# Patient Record
Sex: Male | Born: 1995 | Race: Black or African American | Hispanic: No | Marital: Single | Smoking: Never smoker
Health system: Southern US, Community
[De-identification: ages and names within clinical notes are randomized; demographics above are authoritative.]

## PROBLEM LIST (undated history)

## (undated) DIAGNOSIS — R001 Bradycardia, unspecified: Secondary | ICD-10-CM

## (undated) DIAGNOSIS — E049 Nontoxic goiter, unspecified: Secondary | ICD-10-CM

## (undated) DIAGNOSIS — J302 Other seasonal allergic rhinitis: Secondary | ICD-10-CM

## (undated) DIAGNOSIS — R5383 Other fatigue: Secondary | ICD-10-CM

## (undated) HISTORY — DX: Bradycardia, unspecified: R00.1

## (undated) HISTORY — PX: ADENOIDECTOMY: SUR15

## (undated) HISTORY — PX: APPENDECTOMY: SHX54

## (undated) HISTORY — DX: Nontoxic goiter, unspecified: E04.9

## (undated) HISTORY — PX: TONSILLECTOMY: SUR1361

## (undated) HISTORY — DX: Other fatigue: R53.83

---
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
## 2016-06-10 ENCOUNTER — Encounter (HOSPITAL_COMMUNITY): Payer: Self-pay | Admitting: Nurse Practitioner

## 2016-06-10 ENCOUNTER — Emergency Department (HOSPITAL_COMMUNITY): Payer: Medicaid Other

## 2016-06-10 ENCOUNTER — Emergency Department (HOSPITAL_COMMUNITY)
Admission: EM | Admit: 2016-06-10 | Discharge: 2016-06-11 | Disposition: A | Discharge status: Home / Self Care | Payer: Medicaid Other | Attending: Emergency Medicine | Admitting: Emergency Medicine

## 2016-06-10 DIAGNOSIS — J029 Acute pharyngitis, unspecified: Secondary | ICD-10-CM | POA: Diagnosis not present

## 2016-06-10 LAB — RAPID STREP SCREEN (MED CTR MEBANE ONLY): STREPTOCOCCUS, GROUP A SCREEN (DIRECT): NEGATIVE

## 2016-06-10 MED ORDER — PREDNISONE 20 MG PO TABS
60.0000 mg | ORAL_TABLET | Freq: Once | ORAL | Status: AC
  Administered 2016-06-11: 60 mg via ORAL
  Filled 2016-06-10: qty 3

## 2016-06-10 MED ORDER — BENZONATATE 100 MG PO CAPS: 100.0000 mg | ORAL_CAPSULE | Freq: Three times a day (TID) | ORAL | 0 refills | Status: AC

## 2016-06-10 MED ORDER — MAGIC MOUTHWASH
10.0000 mL | Freq: Once | ORAL | Status: AC
  Administered 2016-06-11: 10 mL via ORAL
  Filled 2016-06-10 (×2): qty 10

## 2016-06-10 MED ORDER — CHLORHEXIDINE GLUCONATE 0.12 % MT SOLN: 15.0000 mL | Freq: Two times a day (BID) | OROMUCOSAL | 0 refills | Status: AC

## 2016-06-10 NOTE — ED Provider Notes (Signed)
[facility]-EMERGENCY DEPT Provider Note   CSN: [id number] Arrival date & time: 06/10/16  2222     History   Chief Complaint Chief Complaint  Patient presents with  . Sore Throat  . Malaise    HPI Gary Mendoza is a 20 y.o. male.  HPI   20 year old male presenting with complaints of cold symptoms. History obtained through mom who is at bedside. For the past for 5 days patient has had generalized body aches, nasal congestion, throat pain, nonproductive cough, subjective fever and chills. Symptom has become progressively worse. He is having difficulty eating and drinking and report early satiety. He has been taking over-the-counter medication including Robitussin, Sudafed, drinking tea with honey and lemon without adequate relief. Denies any recent sick contact. Has history of Hashimoto thyroiditis in the past. He has had his tonsil removed several years prior due to recurrent strep infection. Currently denies having active chest pain, short of breath, wheezing, hemoptysis, abdominal cramping, nausea vomiting diarrhea or rash.  Past Medical History:  Diagnosis Date  . Chest pain   . Fatigue   . Goiter   . Goiter, unspecified   . Seasonal allergies   . Sinus bradycardia by electrocardiogram     Patient Active Problem List   Diagnosis Date Noted  . Sinus bradycardia by electrocardiogram   . Goiter   . Fatigue   . Chest pain   . Goiter, unspecified 11/09/2010    Past Surgical History:  Procedure Laterality Date  . ADENOIDECTOMY    . APPENDECTOMY    . TONSILLECTOMY         Home Medications    Prior to Admission medications   Medication Sig Start Date End Date Taking? Authorizing Provider  cetirizine (ZYRTEC) 10 MG tablet Take 1 tablet (10 mg total) by mouth daily. 11/11/13   Junius FinnerErin O'Malley, PA-C  mometasone (NASONEX) 50 MCG/ACT nasal spray Place 2 sprays into the nose daily. Two sprays in each nostril until symptoms improve, then 1 spray to each nostril daily during allergy  season 11/11/13   Junius FinnerErin O'Malley, PA-C    Family History Family History  Problem Relation Age of Onset  . Thyroid disease Mother     Luiz BlareGraves' disease with exophthalmos, treated with I-131.  Marland Kitchen. Anemia Mother   . Thyroid disease Maternal Grandmother   . Obesity Maternal Grandmother   . Diabetes Maternal Grandmother   . Cancer Neg Hx     Social History Social History  Substance Use Topics  . Smoking status: Never Smoker  . Smokeless tobacco: Not on file  . Alcohol use No     Allergies   Patient has no known allergies.   Review of Systems Review of Systems  All other systems reviewed and are negative.    Physical Exam Updated Vital Signs BP 153/67 (BP Location: Right Arm)   Pulse (!) 50   Temp 98.3 F (36.8 C) (Oral)   Resp 16   SpO2 99%   Physical Exam  Constitutional: He is oriented to person, place, and time. He appears well-developed and well-nourished. No distress.  Nontoxic in appearance, resting in no acute distress.  HENT:  Head: Atraumatic.  Ears: Scarring to bilateral TMs without any signs of infection or effusion Nose: Mild rhinorrhea Throat: Uvula is midline no tonsillar enlargement or exudates, no trismus  Eyes: Conjunctivae are normal.  Neck: Neck supple.  No nuchal rigidity, no thyromegaly, no stridor.  Cardiovascular: Normal rate and regular rhythm.   Pulmonary/Chest: Effort normal and breath sounds normal.  No respiratory distress. He has no wheezes.  Abdominal: Soft. He exhibits no distension. There is no tenderness.  No splenomegaly  Neurological: He is alert and oriented to person, place, and time.  Skin: No rash noted.  Psychiatric: He has a normal mood and affect.  Nursing note and vitals reviewed.    ED Treatments / Results  Labs (all labs ordered are listed, but only abnormal results are displayed) Labs Reviewed  RAPID STREP SCREEN (NOT AT [facility])  CULTURE, GROUP A STREP [facility])    EKG  EKG Interpretation None        Radiology No results found.  Procedures Procedures (including critical care time)  Medications Ordered in ED Medications  magic mouthwash (not administered)  predniSONE (DELTASONE) tablet 60 mg (not administered)     Initial Impression / Assessment and Plan / ED Course  I have reviewed the triage vital signs and the nursing notes.  Pertinent labs & imaging results that were available during my care of the patient were reviewed by me and considered in my medical decision making (see chart for details).  Clinical Course     BP 153/67 (BP Location: Right Arm)   Pulse (!) 50   Temp 98.3 F (36.8 C) (Oral)   Resp 16   SpO2 99%    Final Clinical Impressions(s) / ED Diagnoses   Final diagnoses:  Viral pharyngitis    New Prescriptions New Prescriptions   BENZONATATE (TESSALON) 100 MG CAPSULE    Take 1 capsule (100 mg total) by mouth every 8 (eight) hours.   CHLORHEXIDINE (PERIDEX) 0.12 % SOLUTION    Use as directed 15 mLs in the mouth or throat 2 (two) times daily.   11:02 PM Patient here with cold symptoms. He is afebrile, vital signs stable. Mild bradycardia but patient is an athlete and I suspect this is likely baseline. He denies any lightheadedness of dizziness.  Will obtain rapid strep test, and chest x-ray.  11:46 PM Strep test negative.  CXR without acute changes.  Will provide sxs treatment.  reassurasnce given. Likely viral resp infection.  F/u with PCP as needed. Return precaution given.     Fayrene HelperBowie Reesha Debes, PA-C 06/10/16 2347    Gerhard Munchobert Lockwood, MD 06/11/16 [phone]

## 2016-06-10 NOTE — ED Triage Notes (Signed)
Pt presents with c/o fever at home, sore throat and difficulty swallowing and "inability to talk, feeling like he is chocking" mother at beside is providing this triage hx. Other c/o include runny nose, congestion, malaise without headache.

## 2016-06-13 LAB — CULTURE, GROUP A STREP (THRC)

## 2017-01-21 ENCOUNTER — Emergency Department (HOSPITAL_COMMUNITY)
Admission: EM | Admit: 2017-01-21 | Discharge: 2017-01-21 | Disposition: A | Discharge status: Home / Self Care | Payer: Medicaid Other | Attending: Emergency Medicine | Admitting: Emergency Medicine

## 2017-01-21 ENCOUNTER — Encounter (HOSPITAL_COMMUNITY): Payer: Self-pay | Admitting: Emergency Medicine

## 2017-01-21 DIAGNOSIS — J029 Acute pharyngitis, unspecified: Secondary | ICD-10-CM | POA: Insufficient documentation

## 2017-01-21 DIAGNOSIS — Z79899 Other long term (current) drug therapy: Secondary | ICD-10-CM | POA: Insufficient documentation

## 2017-01-21 LAB — RAPID STREP SCREEN (MED CTR MEBANE ONLY): Streptococcus, Group A Screen (Direct): NEGATIVE

## 2017-01-21 NOTE — Discharge Instructions (Signed)
Please read instructions below.  You can take tylenol or advil as needed for sore throat or body aches.  Drink plenty of water. Eat soft foods until your throat feels better. Follow up with your primary care provider as needed.  Return to the ER for difficulty swallowing liquids, difficulty breathing, or new or worsening symptoms.

## 2017-01-21 NOTE — ED Provider Notes (Signed)
[facility]-EMERGENCY DEPT Provider Note   CSN: [id number] Arrival date & time: 01/21/17  1421  By signing my name below, I, Ny'Kea Lewis, attest that this documentation has been prepared under the direction and in the presence of [place] Russo, PA-C.  Electronically Signed: Karren Cobble, ED Scribe. 01/21/17. 4:27 PM.  History   Chief Complaint Chief Complaint  Patient presents with  . Sore Throat   The history is provided by the patient. No language interpreter was used.   HPI Comments: Gary Mendoza is a 21 y.o. male who presents to the Emergency Department complaining of sudden onset, gradually worsening sore throat that began one week ago. He notes associated pain with swallowing, burning sensation when swallowing, and a mild non-productive cough. He has tried drinking hot tea with mild relief. He reports recent sick contact with similar symptoms. PSHx of tonsillectomy and adenoidectomy. Denies trouble swallowing, fever, congestion, ear pain, or, abdominal pain.   Past Medical History:  Diagnosis Date  . Chest pain   . Fatigue   . Goiter   . Goiter, unspecified   . Seasonal allergies   . Sinus bradycardia by electrocardiogram     Patient Active Problem List   Diagnosis Date Noted  . Sinus bradycardia by electrocardiogram   . Goiter   . Fatigue   . Chest pain   . Goiter, unspecified 11/09/2010    Past Surgical History:  Procedure Laterality Date  . ADENOIDECTOMY    . APPENDECTOMY    . TONSILLECTOMY      Home Medications    Prior to Admission medications   Medication Sig Start Date End Date Taking? Authorizing Provider  benzonatate (TESSALON) 100 MG capsule Take 1 capsule (100 mg total) by mouth every 8 (eight) hours. 06/10/16   Fayrene Helper, PA-C  cetirizine (ZYRTEC) 10 MG tablet Take 1 tablet (10 mg total) by mouth daily. 11/11/13   Lurene Shadow, PA-C  chlorhexidine (PERIDEX) 0.12 % solution Use as directed 15 mLs in the mouth or throat 2 (two) times daily. 06/10/16    Fayrene Helper, PA-C  mometasone (NASONEX) 50 MCG/ACT nasal spray Place 2 sprays into the nose daily. Two sprays in each nostril until symptoms improve, then 1 spray to each nostril daily during allergy season 11/11/13   Lurene Shadow, PA-C   Family History Family History  Problem Relation Age of Onset  . Thyroid disease Mother        Luiz Blare' disease with exophthalmos, treated with I-131.  Marland Kitchen Anemia Mother   . Thyroid disease Maternal Grandmother   . Obesity Maternal Grandmother   . Diabetes Maternal Grandmother   . Cancer Neg Hx    Social History Social History  Substance Use Topics  . Smoking status: Never Smoker  . Smokeless tobacco: Never Used  . Alcohol use No   Allergies   Patient has no known allergies.  Review of Systems Review of Systems  Constitutional: Negative for fever.  HENT: Positive for sore throat. Negative for congestion, ear pain, trouble swallowing and voice change.   Respiratory: Positive for cough. Negative for shortness of breath.   Gastrointestinal: Negative for abdominal pain.   Physical Exam Updated Vital Signs BP 123/84 (BP Location: Left Arm)   Pulse 64   Temp 98.1 F (36.7 C) (Oral)   Resp 17   Ht 5\' 9"  (1.753 m)   Wt 72.6 kg (160 lb)   SpO2 99%   BMI 23.63 kg/m   Physical Exam  Constitutional: He appears well-developed and  well-nourished. No distress.  Tolerating secretions. Well appearing.  HENT:  Head: Normocephalic and atraumatic.  Right Ear: Tympanic membrane, external ear and ear canal normal.  Left Ear: Tympanic membrane, external ear and ear canal normal.  Nose: Nose normal.  Mouth/Throat: Uvula is midline. No trismus in the jaw. No uvula swelling. Posterior oropharyngeal erythema present. No oropharyngeal exudate or posterior oropharyngeal edema.  Eyes: Conjunctivae are normal.  Neck: Normal range of motion. Neck supple.  Cardiovascular: Normal rate, regular rhythm, normal heart sounds and intact distal pulses.  Exam reveals no  friction rub.   No murmur heard. Pulmonary/Chest: Effort normal and breath sounds normal. No stridor. No respiratory distress. He has no wheezes. He has no rales.  Abdominal: Soft. Bowel sounds are normal. He exhibits no distension. There is no tenderness. There is no rebound and no guarding.  Lymphadenopathy:    He has cervical adenopathy (mild anterior).  Psychiatric: He has a normal mood and affect. His behavior is normal.  Nursing note and vitals reviewed.  ED Treatments / Results  DIAGNOSTIC STUDIES: Oxygen Saturation is 99% on RA, normal by my interpretation.   COORDINATION OF CARE: 4:10 PM-Discussed next steps with pt. Pt verbalized understanding and is agreeable with the plan.   Labs (all labs ordered are listed, but only abnormal results are displayed) Labs Reviewed  RAPID STREP SCREEN (NOT [facility])  CULTURE, GROUP A STREP [facility])    EKG  EKG Interpretation None       Radiology No results found.  Procedures Procedures (including critical care time)  Medications Ordered in ED Medications - No data to display   Initial Impression / Assessment and Plan / ED Course  I have reviewed the triage vital signs and the nursing notes.  Pertinent labs & imaging results that were available during my care of the patient were reviewed by me and considered in my medical decision making (see chart for details).     Patients symptoms are consistent with pharyngitis, likely viral etiology. Patient is afebrile, tolerating secretions. Presentation not concerning for PTA or infxn spread to soft tissue. No difficulty breathing or swallowing. No trismus or uvula deviation. Discussed that antibiotics are not indicated for viral infections. Pt will be discharged with symptomatic treatment. Strict return precautions given. Pt is hemodynamically stable and in NAD prior to discharge.  Discussed results, findings, treatment and follow up. Patient advised of return precautions. Patient  verbalized understanding and agreed with plan.   Final Clinical Impressions(s) / ED Diagnoses   Final diagnoses:  Viral pharyngitis    New Prescriptions Discharge Medication List as of 01/21/2017  4:35 PM    I personally performed the services described in this documentation, which was scribed in my presence. The recorded information has been reviewed and is accurate.     Russo, [place] N, PA-C 01/21/17 1703    Doug SouJacubowitz, Sam, MD 01/21/17 2119

## 2017-01-21 NOTE — ED Triage Notes (Signed)
Pt. Stated, I've had a sore throat for 1 week.

## 2017-01-22 ENCOUNTER — Emergency Department (HOSPITAL_COMMUNITY): Payer: Medicaid Other

## 2017-01-22 ENCOUNTER — Emergency Department (HOSPITAL_COMMUNITY)
Admission: EM | Admit: 2017-01-22 | Discharge: 2017-01-22 | Disposition: A | Discharge status: Home / Self Care | Payer: Medicaid Other | Attending: Emergency Medicine | Admitting: Emergency Medicine

## 2017-01-22 ENCOUNTER — Encounter (HOSPITAL_COMMUNITY): Payer: Self-pay | Admitting: Emergency Medicine

## 2017-01-22 DIAGNOSIS — R079 Chest pain, unspecified: Secondary | ICD-10-CM | POA: Insufficient documentation

## 2017-01-22 LAB — CBC
HCT: 44.7 % (ref 39.0–52.0)
Hemoglobin: 16.4 g/dL (ref 13.0–17.0)
MCH: 31.4 pg (ref 26.0–34.0)
MCHC: 36.7 g/dL — ABNORMAL HIGH (ref 30.0–36.0)
MCV: 85.5 fL (ref 78.0–100.0)
PLATELETS: 258 10*3/uL (ref 150–400)
RBC: 5.23 MIL/uL (ref 4.22–5.81)
RDW: 12.2 % (ref 11.5–15.5)
WBC: 5.8 10*3/uL (ref 4.0–10.5)

## 2017-01-22 LAB — TSH: TSH: 0.567 u[IU]/mL (ref 0.350–4.500)

## 2017-01-22 LAB — D-DIMER, QUANTITATIVE: D-Dimer, Quant: <0.27 ug/mL-FEU (ref 0.00–0.50)

## 2017-01-22 LAB — BASIC METABOLIC PANEL
Anion gap: 7 (ref 5–15)
BUN: 9 mg/dL (ref 6–20)
CHLORIDE: 102 mmol/L (ref 101–111)
CO2: 30 mmol/L (ref 22–32)
CREATININE: 1.19 mg/dL (ref 0.61–1.24)
Calcium: 9.7 mg/dL (ref 8.9–10.3)
GFR calc non Af Amer: >60 mL/min (ref >60)
Glucose, Bld: 79 mg/dL (ref 65–99)
Potassium: 3.7 mmol/L (ref 3.5–5.1)
Sodium: 139 mmol/L (ref 135–145)

## 2017-01-22 LAB — POCT I-STAT TROPONIN I
TROPONIN I, POC: 0 ng/mL (ref 0.00–0.08)
Troponin i, poc: 0 ng/mL (ref 0.00–0.08)

## 2017-01-22 LAB — T4, FREE: Free T4: 0.86 ng/dL (ref 0.61–1.12)

## 2017-01-22 MED ORDER — PANTOPRAZOLE SODIUM 20 MG PO TBEC: 20.0000 mg | DELAYED_RELEASE_TABLET | Freq: Every day | ORAL | 1 refills | Status: AC

## 2017-01-22 MED ORDER — FAMOTIDINE 20 MG PO TABS: 20.0000 mg | ORAL_TABLET | Freq: Two times a day (BID) | ORAL | 0 refills | Status: AC

## 2017-01-22 MED ORDER — GI COCKTAIL ~~LOC~~
30.0000 mL | Freq: Once | ORAL | Status: AC
  Administered 2017-01-22: 30 mL via ORAL
  Filled 2017-01-22: qty 30

## 2017-01-22 NOTE — Discharge Instructions (Signed)
Your lab results are reassuring today.   Begin taking the Protonix daily. Take this medication 20-30 minutes before your first meal the day, regardless of how you feel.  Take the Pepcid, as prescribed.  Avoid alcohol, NSAIDs such as ibuprofen or naproxen, spicy foods, and anything else that aggravates the sensation you are experiencing.   Follow up with gastroenterology as soon as possible. Call the number provided to set up an appointment.   Follow up with endocrinology as soon as possible.

## 2017-01-22 NOTE — ED Provider Notes (Signed)
[facility]-EMERGENCY DEPT Provider Note   CSN: [id number] Arrival date & time: 01/22/17  1159   By signing my name below, I, Clarisse GougeXavier Herndon, attest that this documentation has been prepared under the direction and in the presence of Sharena Dibenedetto, PA-C. Electronically signed, Clarisse GougeXavier Herndon, ED Scribe. 01/22/17. 1:57 PM.  History   Chief Complaint Chief Complaint  Patient presents with  . Chest Pain   The history is provided by the patient, medical records and a parent. No language interpreter was used.    Gary Mendoza is a 21 y.o. male with h/o hashimoto's, chest pain, bradycardia, chronic fatigue, and goiter presenting to the Emergency Department concerning intermittent chest pain x 1 week.  He describes 9/10, throbbing, non-radiating pain, and he states the pain is worse with eating and drinking as well as lying supine. Patient states he has not experienced these symptoms before. Patient was once followed by a pediatric endocrinologist with last visit in 2014, however, states he was told he no longer needed their care. No N/V/D, fever/chills, abdominal pain, cough, rash, SOB, heat/cold intolerance, dizziness, headache, or any other complaints.    Past Medical History:  Diagnosis Date  . Chest pain   . Fatigue   . Goiter   . Goiter, unspecified   . Seasonal allergies   . Sinus bradycardia by electrocardiogram     Patient Active Problem List   Diagnosis Date Noted  . Sinus bradycardia by electrocardiogram   . Goiter   . Fatigue   . Chest pain   . Goiter, unspecified 11/09/2010    Past Surgical History:  Procedure Laterality Date  . ADENOIDECTOMY    . APPENDECTOMY    . TONSILLECTOMY         Home Medications    Prior to Admission medications   Medication Sig Start Date End Date Taking? Authorizing Provider  benzonatate (TESSALON) 100 MG capsule Take 1 capsule (100 mg total) by mouth every 8 (eight) hours. Patient taking differently: Take 100 mg by mouth every 8  (eight) hours as needed for cough.  06/10/16  Yes Fayrene Helperran, [place], PA-C  cetirizine (ZYRTEC) 10 MG tablet Take 1 tablet (10 mg total) by mouth daily. Patient taking differently: Take 10 mg by mouth daily as needed for allergies.  11/11/13  Yes Phelps, Erin O, PA-C  chlorhexidine (PERIDEX) 0.12 % solution Use as directed 15 mLs in the mouth or throat 2 (two) times daily. Patient taking differently: Use as directed 15 mLs in the mouth or throat 2 (two) times daily as needed.  06/10/16  Yes Fayrene Helperran, [place], PA-C  famotidine (PEPCID) 20 MG tablet Take 1 tablet (20 mg total) by mouth 2 (two) times daily. 01/22/17 01/27/17  Aletta Edmunds C, PA-C  pantoprazole (PROTONIX) 20 MG tablet Take 1 tablet (20 mg total) by mouth daily. 01/22/17   Chrisangel Eskenazi, Hillard DankerShawn C, PA-C    Family History Family History  Problem Relation Age of Onset  . Thyroid disease Mother        Luiz BlareGraves' disease with exophthalmos, treated with I-131.  Marland Kitchen. Anemia Mother   . Thyroid disease Maternal Grandmother   . Obesity Maternal Grandmother   . Diabetes Maternal Grandmother   . Cancer Neg Hx     Social History Social History  Substance Use Topics  . Smoking status: Never Smoker  . Smokeless tobacco: Never Used  . Alcohol use No     Allergies   Pineapple and Bee venom   Review of Systems Review of Systems  Constitutional:  Negative for diaphoresis, fatigue and fever.  HENT: Negative for rhinorrhea and trouble swallowing.        Pain in the chest with swallowing.  Eyes: Negative for visual disturbance.  Respiratory: Negative for cough, chest tightness and shortness of breath.   Cardiovascular: Positive for chest pain. Negative for palpitations and leg swelling.  Gastrointestinal: Negative for abdominal pain, diarrhea, nausea and vomiting.  Musculoskeletal: Negative for back pain and neck pain.  Skin: Negative for rash.  Neurological: Negative for dizziness, syncope, weakness, light-headedness, numbness and headaches.     Physical  Exam Updated Vital Signs BP 120/70 (BP Location: Left Arm)   Pulse (!) 48   Temp 98.2 F (36.8 C) (Oral)   Resp 19   Ht 5\' 9"  (1.753 m)   Wt 160 lb (72.6 kg)   SpO2 100%   BMI 23.63 kg/m   Physical Exam  Constitutional: He appears well-developed and well-nourished. No distress.  HENT:  Head: Normocephalic and atraumatic.  Mouth/Throat: Oropharynx is clear and moist.  Eyes: Pupils are equal, round, and reactive to light. Conjunctivae and EOM are normal.  Neck: Normal range of motion, full passive range of motion without pain and phonation normal. Neck supple. No JVD present. No tracheal tenderness present. No edema present.  Cardiovascular: Normal rate, regular rhythm, normal heart sounds and intact distal pulses.   Pulmonary/Chest: Effort normal and breath sounds normal. No stridor. No respiratory distress. He exhibits no tenderness.  No increased pain with leaning forward.  Abdominal: Soft. There is no tenderness. There is no guarding.  Musculoskeletal: He exhibits no edema.  Lymphadenopathy:    He has no cervical adenopathy.  Neurological: He is alert.  Skin: Skin is warm and dry. Capillary refill takes less than 2 seconds. He is not diaphoretic.  Psychiatric: He has a normal mood and affect. His behavior is normal.  Nursing note and vitals reviewed.    ED Treatments / Results  DIAGNOSTIC STUDIES: Oxygen Saturation is 100% on RA, NL by my interpretation.    COORDINATION OF CARE: 1:52 PM-Discussed next steps with pt. Pt verbalized understanding and is agreeable with the plan. Will order IV, thyroid test, D-Dimer and GI cocktail. Pt prepared for d/c, advised of symptomatic care at home, F/U instructions and return precautions.    Labs (all labs ordered are listed, but only abnormal results are displayed) Labs Reviewed  CBC - Abnormal; Notable for the following:       Result Value   MCHC 36.7 (*)    All other components within normal limits  BASIC METABOLIC PANEL   D-DIMER, QUANTITATIVE (NOT AT [facility])  TSH  T4, FREE  I-STAT TROPOININ, ED  POCT I-STAT TROPONIN I  I-STAT TROPOININ, ED  POCT I-STAT TROPONIN I    EKG  EKG Interpretation  Date/Time:  Tuesday January 22 2017 12:05:33 EDT Ventricular Rate:  51 PR Interval:    QRS Duration: 93 QT Interval:  388 QTC Calculation: 358 R Axis:   53 Text Interpretation:  Sinus rhythm Probable anteroseptal infarct, old No acute changes No significant change since last tracing Confirmed by Derwood Kaplan [phone]) on 01/22/2017 4:26:31 PM       Radiology Dg Chest 2 View  Result Date: 01/22/2017 CLINICAL DATA:  21 year old male with sharp pain in the sternal area for the past 2-1/2 weeks. Difficulty eating. Loss of appetite. EXAM: CHEST  2 VIEW COMPARISON:  Chest x-ray 06/10/2016. FINDINGS: Lung volumes are normal. No consolidative airspace disease. No pleural effusions. No pneumothorax. No  pulmonary nodule or mass noted. Pulmonary vasculature and the cardiomediastinal silhouette are within normal limits. IMPRESSION: No radiographic evidence of acute cardiopulmonary disease. Electronically Signed   By: Trudie Reed M.D.   On: 01/22/2017 12:33    Procedures Procedures (including critical care time)  Medications Ordered in ED Medications  gi cocktail (Maalox,Lidocaine,Donnatal) (30 mLs Oral Given 01/22/17 1414)     Initial Impression / Assessment and Plan / ED Course  I have reviewed the triage vital signs and the nursing notes.  Pertinent labs & imaging results that were available during my care of the patient were reviewed by me and considered in my medical decision making (see chart for details).  Clinical Course as of Jan 24 1448  Tue Jan 22, 2017  1557 Pain has resolved following GI cocktail.   [SJ]    Clinical Course User Index [SJ] Marijane Trower C, PA-C    Patient presents with intermittent, atypical chest pain. Worse with eating and lying supine. Pain is not exertional. Patient's  description and pain pattern gives suspicion for upper GI source of the patient's discomfort rather than cardiac or pulmonary. D-dimer negative. Delta troponins negative. Symptoms relieved with GI cocktail. GI and endocrinology follow-up. The patient was given instructions for home care as well as return precautions. Patient voices understanding of these instructions, accepts the plan, and is comfortable with discharge.  Findings and plan of care discussed with Derwood Kaplan, MD.    Vitals:   01/22/17 1208 01/22/17 1323 01/22/17 1504 01/22/17 1535  BP: 120/70   116/68  Pulse: (!) 48 (!) 44 (!) 44 (!) 44  Resp: 19  18   Temp: 98.2 F (36.8 C)   98.3 F (36.8 C)  TempSrc: Oral   Oral  SpO2: 100% 100%  100%  Weight:      Height:      Has previous history of bradycardia. States a HR in the 40s is normal for him.   Final Clinical Impressions(s) / ED Diagnoses   Final diagnoses:  Chest pain, unspecified type    New Prescriptions Discharge Medication List as of 01/22/2017  4:28 PM    START taking these medications   Details  famotidine (PEPCID) 20 MG tablet Take 1 tablet (20 mg total) by mouth 2 (two) times daily., Starting Tue 01/22/2017, Until Sun 01/27/2017, Print    pantoprazole (PROTONIX) 20 MG tablet Take 1 tablet (20 mg total) by mouth daily., Starting Tue 01/22/2017, Print      I personally performed the services described in this documentation, which was scribed in my presence. The recorded information has been reviewed and is accurate.   Anselm Pancoast, PA-C 01/23/17 1450    Derwood Kaplan, MD 01/24/17 [phone]

## 2017-01-22 NOTE — ED Notes (Signed)
Pt is alert and oriented and verbally responsive.Pt mother is at bedside , and anxious as to how long pt will continue to be here in the ED. Pt second troponin level has been drawn.

## 2017-01-22 NOTE — ED Triage Notes (Signed)
Patient having mid chest pain for over week. Patient reports that when he is sitting still pain is sharp then when patient is eating or drinking feels like a choking sensation.

## 2017-01-23 LAB — CULTURE, GROUP A STREP (THRC)

## 2017-02-09 IMAGING — CR DG CHEST 2V
2 series · 2 of 2 positions shown · non-contrast
Comparison: Chest radiograph May 02, 2011

CLINICAL DATA: Sore throat, dysphagia, choking sensation. Runny
nose, congestion. History of goiter.

EXAM:
CHEST  2 VIEW

[w chest pa]
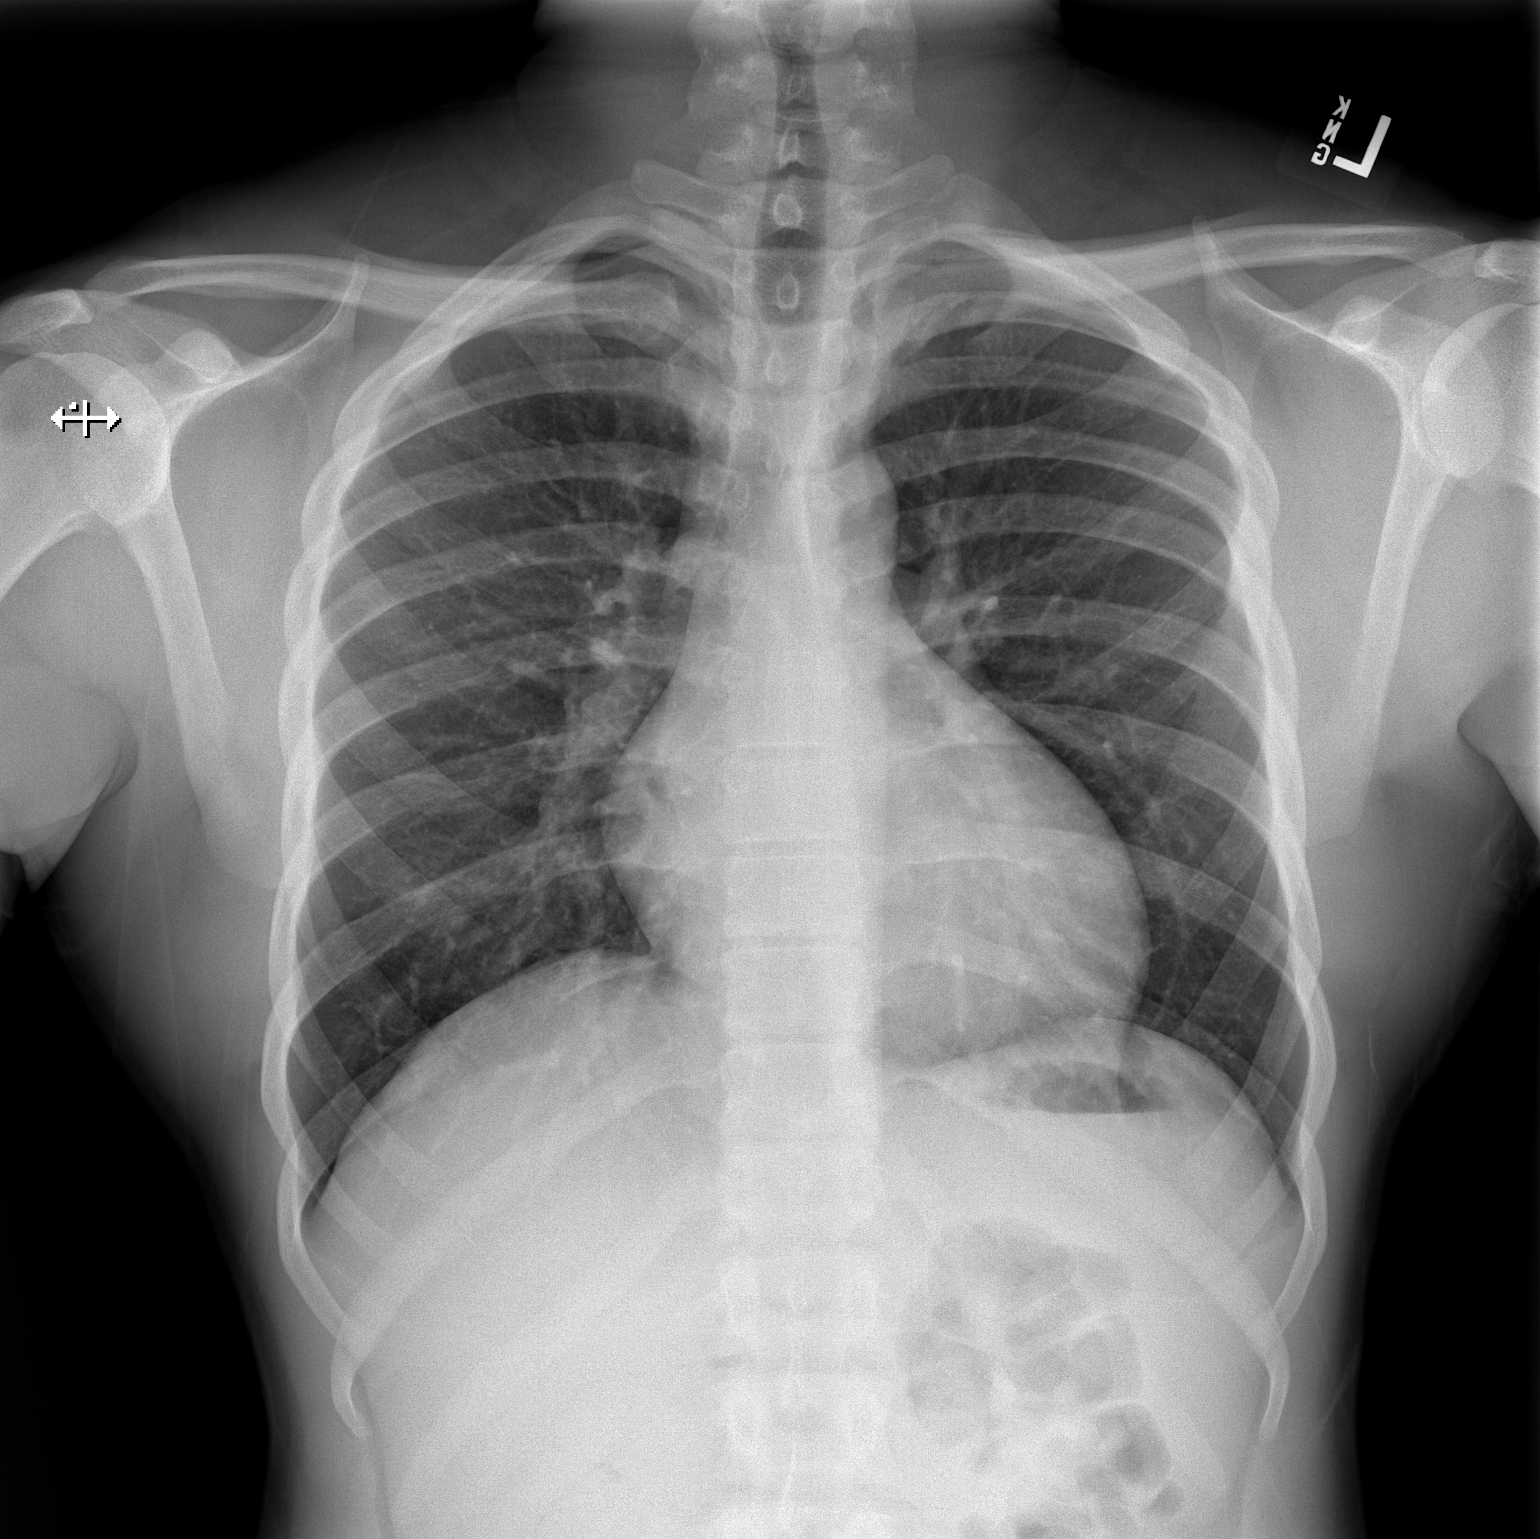

[w chest lat]
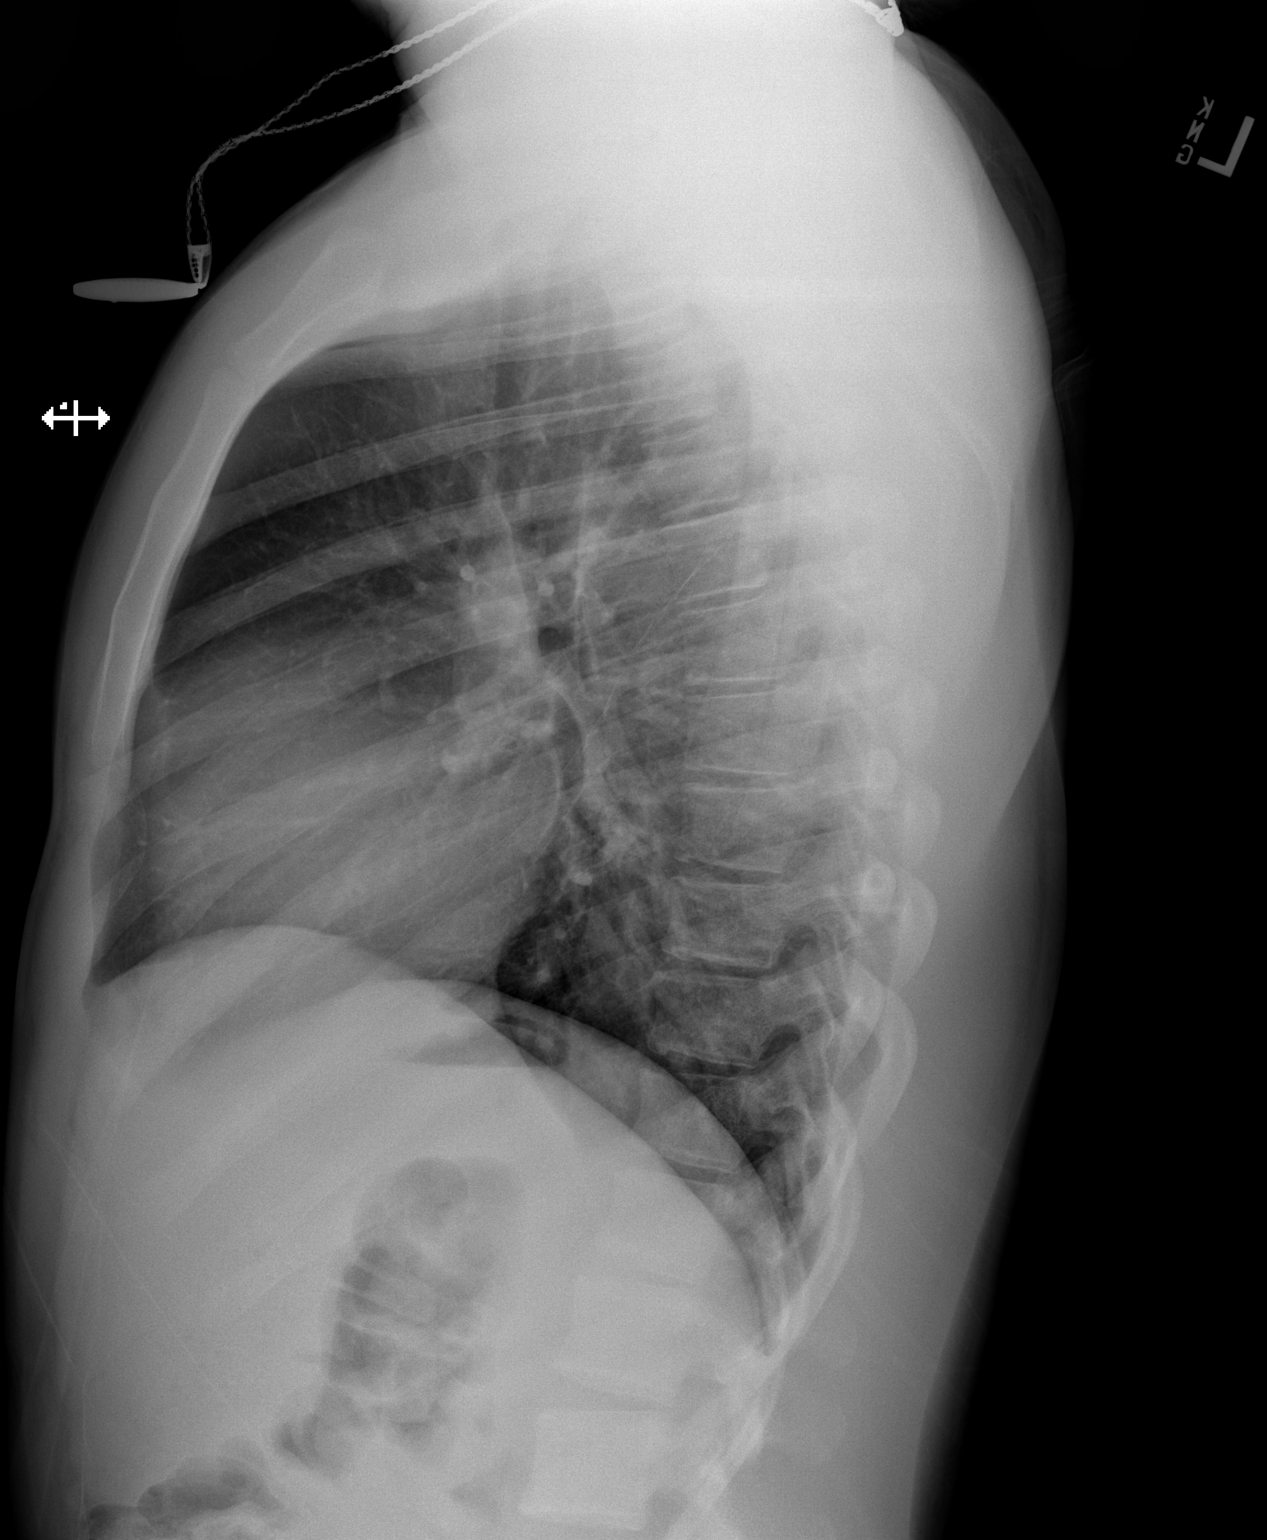

[2 of 2 positions shown; findings below may reference images not displayed]

FINDINGS: Cardiomediastinal silhouette is normal. No pleural effusions or
focal consolidations. Trachea projects midline and there is no
pneumothorax. Soft tissue planes and included osseous structures are
non-suspicious.
IMPRESSION: Normal chest.

## 2017-09-23 IMAGING — CR DG CHEST 2V
2 series · 2 of 2 positions shown · non-contrast
Comparison: Chest x-ray 06/10/2016.

CLINICAL DATA: 21-year-old male with sharp pain in the sternal area
for the past 2-1/2 weeks. Difficulty eating. Loss of appetite.

EXAM:
CHEST  2 VIEW

[w chest pa]
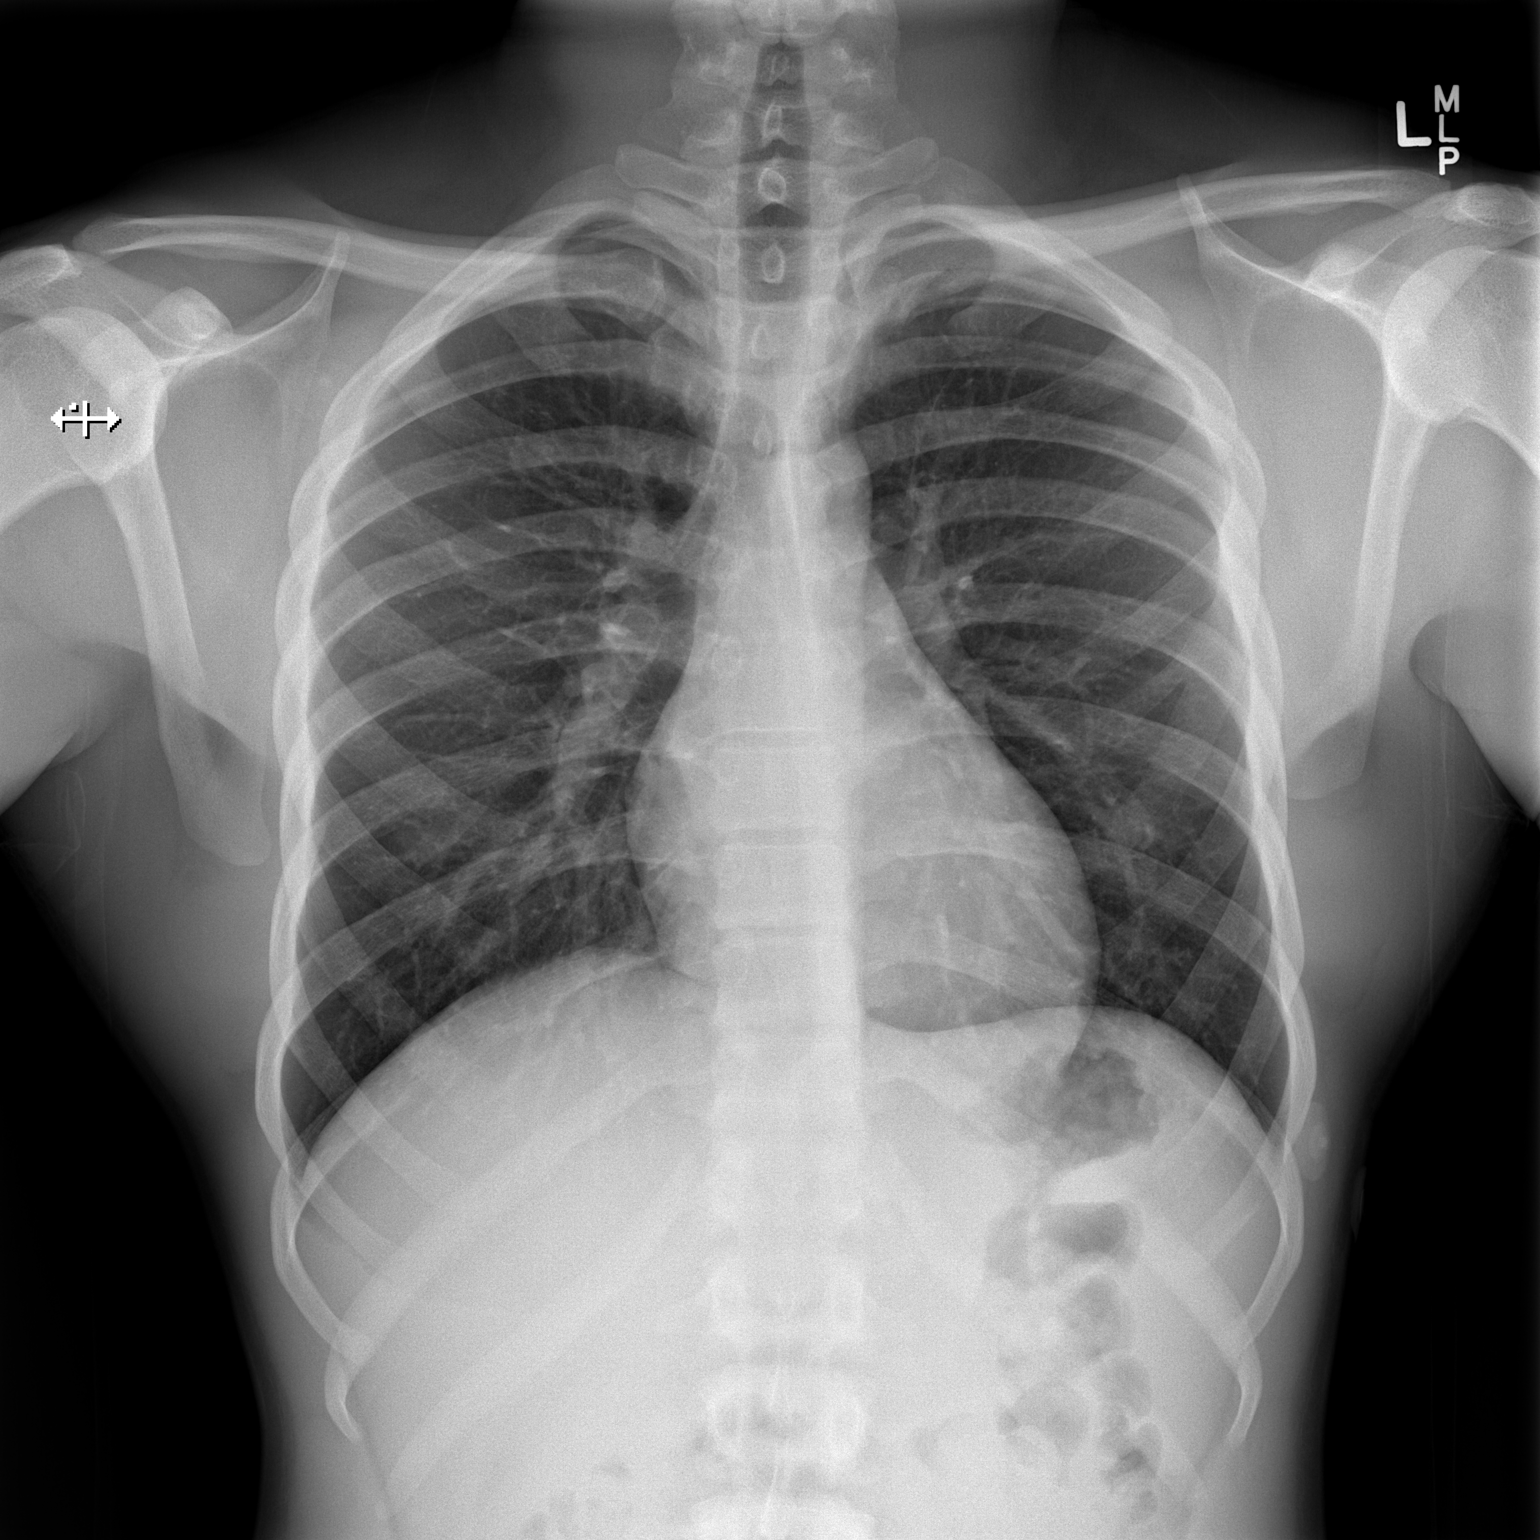

[w chest lat]
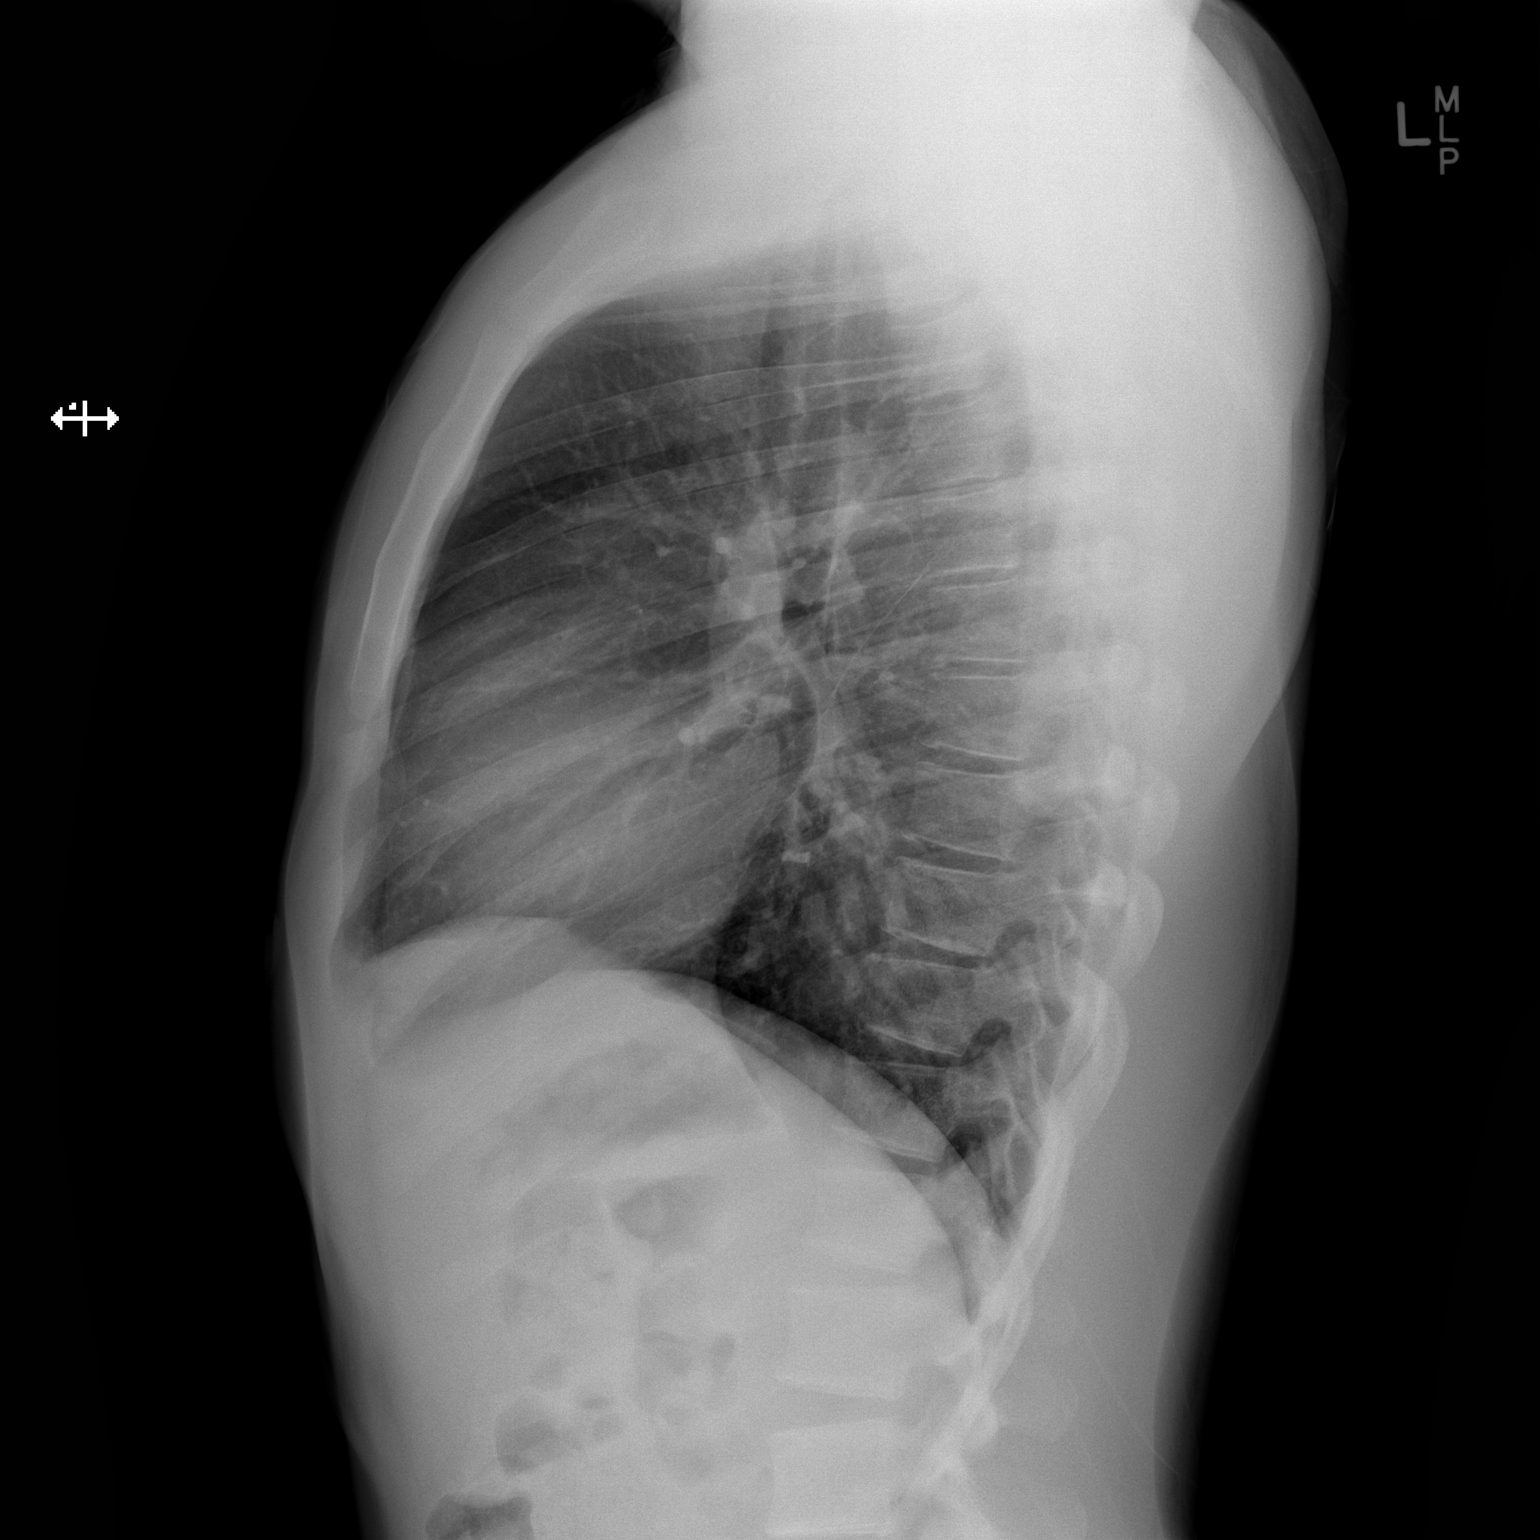

[2 of 2 positions shown; findings below may reference images not displayed]

FINDINGS: Lung volumes are normal. No consolidative airspace disease. No
pleural effusions. No pneumothorax. No pulmonary nodule or mass
noted. Pulmonary vasculature and the cardiomediastinal silhouette
are within normal limits.
IMPRESSION: No radiographic evidence of acute cardiopulmonary disease.

## 2020-07-20 ENCOUNTER — Other Ambulatory Visit: Payer: 59

## 2020-07-20 DIAGNOSIS — Z20822 Contact with and (suspected) exposure to covid-19: Secondary | ICD-10-CM

## 2020-07-22 LAB — SARS-COV-2, NAA 2 DAY TAT

## 2020-07-22 LAB — NOVEL CORONAVIRUS, NAA: SARS-CoV-2, NAA: NOT DETECTED

## 2021-05-17 ENCOUNTER — Ambulatory Visit (HOSPITAL_COMMUNITY): Payer: Self-pay

## 2021-08-06 ENCOUNTER — Encounter (HOSPITAL_COMMUNITY): Payer: Self-pay

## 2021-08-06 ENCOUNTER — Other Ambulatory Visit: Payer: Self-pay

## 2021-08-06 ENCOUNTER — Emergency Department (HOSPITAL_COMMUNITY)
Admission: EM | Admit: 2021-08-06 | Discharge: 2021-08-06 | Disposition: A | Discharge status: Home / Self Care | Payer: Self-pay | Attending: Emergency Medicine | Admitting: Emergency Medicine

## 2021-08-06 DIAGNOSIS — R197 Diarrhea, unspecified: Secondary | ICD-10-CM | POA: Insufficient documentation

## 2021-08-06 DIAGNOSIS — R1110 Vomiting, unspecified: Secondary | ICD-10-CM | POA: Insufficient documentation

## 2021-08-06 LAB — URINALYSIS, ROUTINE W REFLEX MICROSCOPIC
Bilirubin Urine: NEGATIVE
Glucose, UA: NEGATIVE mg/dL
Hgb urine dipstick: NEGATIVE
Ketones, ur: 5 mg/dL — AB
Leukocytes,Ua: NEGATIVE
Nitrite: NEGATIVE
Protein, ur: 30 mg/dL — AB
Specific Gravity, Urine: 1.032 — ABNORMAL HIGH (ref 1.005–1.030)
pH: 6 (ref 5.0–8.0)

## 2021-08-06 LAB — CBC
HCT: 50.2 % (ref 39.0–52.0)
Hemoglobin: 17.3 g/dL — ABNORMAL HIGH (ref 13.0–17.0)
MCH: 30.6 pg (ref 26.0–34.0)
MCHC: 34.5 g/dL (ref 30.0–36.0)
MCV: 88.7 fL (ref 80.0–100.0)
Platelets: 234 K/uL (ref 150–400)
RBC: 5.66 MIL/uL (ref 4.22–5.81)
RDW: 12.2 % (ref 11.5–15.5)
WBC: 6.2 K/uL (ref 4.0–10.5)
nRBC: 0 % (ref 0.0–0.2)

## 2021-08-06 LAB — COMPREHENSIVE METABOLIC PANEL WITH GFR
ALT: 28 U/L (ref 0–44)
AST: 26 U/L (ref 15–41)
Albumin: 5.1 g/dL — ABNORMAL HIGH (ref 3.5–5.0)
Alkaline Phosphatase: 45 U/L (ref 38–126)
Anion gap: 9 (ref 5–15)
BUN: 17 mg/dL (ref 6–20)
CO2: 31 mmol/L (ref 22–32)
Calcium: 9.6 mg/dL (ref 8.9–10.3)
Chloride: 100 mmol/L (ref 98–111)
Creatinine, Ser: 1.22 mg/dL (ref 0.61–1.24)
GFR, Estimated: >60 mL/min (ref >60)
Glucose, Bld: 88 mg/dL (ref 70–99)
Potassium: 3.7 mmol/L (ref 3.5–5.1)
Sodium: 140 mmol/L (ref 135–145)
Total Bilirubin: 1.3 mg/dL — ABNORMAL HIGH (ref 0.3–1.2)
Total Protein: 8 g/dL (ref 6.5–8.1)

## 2021-08-06 LAB — LIPASE, BLOOD: Lipase: 25 U/L (ref 11–51)

## 2021-08-06 MED ORDER — SODIUM CHLORIDE 0.9 % IV BOLUS
1000.0000 mL | Freq: Once | INTRAVENOUS | Status: AC
  Administered 2021-08-06: 1000 mL via INTRAVENOUS

## 2021-08-06 MED ORDER — ONDANSETRON 4 MG PO TBDP
4.0000 mg | ORAL_TABLET | Freq: Once | ORAL | Status: AC
Start: 2021-08-06 — End: 2021-08-06
  Administered 2021-08-06: 4 mg via ORAL
  Filled 2021-08-06: qty 1

## 2021-08-06 MED ORDER — ONDANSETRON 4 MG PO TBDP: ORAL_TABLET | ORAL | 0 refills | Status: AC

## 2021-08-06 NOTE — ED Provider Notes (Signed)
°  Physical Exam  BP 140/63    Pulse (!) 52    Temp 98.2 F (36.8 C) (Oral)    Resp 18    SpO2 100%   Physical Exam  Procedures  Procedures  ED Course / MDM    Medical Decision Making Care assumed at 3 pm.  Patient is here with gastroenteritis symptoms.  Patient Labs sent in triage. Signout pending labs and p.o. trial  4:11 PM Labs unremarkable.  Patient tolerated p.o. already.  Requested bag of fluids which was given. Patient states that he had some abdominal cramps which went away now.  Abdomen remains nontender.  Likely viral gastroenteritis.  Patient stable for discharge   Problems Addressed: Vomiting and diarrhea: acute illness or injury  Amount and/or Complexity of Data Reviewed Labs: ordered. Decision-making details documented in ED Course.  Risk Prescription drug management.         Charlynne Pander, MD 08/06/21 [phone]

## 2021-08-06 NOTE — Discharge Instructions (Addendum)
Stay hydrated   Take zofran for nausea   See your doctor   Return to ER if you have worse vomiting, diarrhea, dehydration, severe abdominal pain.

## 2021-08-06 NOTE — ED Triage Notes (Signed)
Pt reports N/V/D and abdominal pain that began last night. Pts children have had similar symptoms.

## 2021-08-07 NOTE — ED Provider Notes (Signed)
[facility]-EMERGENCY DEPT Provider Note   CSN: [id number] Arrival date & time: 08/06/21  1307     History  Chief Complaint  Patient presents with   Emesis   Diarrhea    Gary Mendoza is a 26 y.o. male.  Patient presents chief complaint of vomiting and diarrhea started last night.  Describes multiple episodes of vomiting few episodes of diarrhea all nonbloody.  He states his family numbers are sick with similar vomiting and diarrhea.  Otherwise denies fevers denies cough denies chest pain or abdominal pain at this time.      Home Medications Prior to Admission medications   Medication Sig Start Date End Date Taking? Authorizing Provider  ondansetron (ZOFRAN-ODT) 4 MG disintegrating tablet 4mg  ODT q4 hours prn nausea/vomit 08/06/21  Yes 08/08/21, MD  benzonatate (TESSALON) 100 MG capsule Take 1 capsule (100 mg total) by mouth every 8 (eight) hours. Patient taking differently: Take 100 mg by mouth every 8 (eight) hours as needed for cough.  06/10/16   14/3/17, PA-C  cetirizine (ZYRTEC) 10 MG tablet Take 1 tablet (10 mg total) by mouth daily. Patient taking differently: Take 10 mg by mouth daily as needed for allergies.  11/11/13   01/11/14, PA-C  chlorhexidine (PERIDEX) 0.12 % solution Use as directed 15 mLs in the mouth or throat 2 (two) times daily. Patient taking differently: Use as directed 15 mLs in the mouth or throat 2 (two) times daily as needed.  06/10/16   14/3/17, PA-C  famotidine (PEPCID) 20 MG tablet Take 1 tablet (20 mg total) by mouth 2 (two) times daily. 01/22/17 01/27/17  Joy, Shawn C, PA-C  pantoprazole (PROTONIX) 20 MG tablet Take 1 tablet (20 mg total) by mouth daily. 01/22/17   Joy, 01/24/17, PA-C      Allergies    Pineapple and Bee venom    Review of Systems   Review of Systems  Constitutional:  Negative for fever.  HENT:  Negative for ear pain and sore throat.   Eyes:  Negative for pain.  Respiratory:  Negative for  cough.   Cardiovascular:  Negative for chest pain.  Gastrointestinal:  Negative for abdominal pain.  Genitourinary:  Negative for flank pain.  Musculoskeletal:  Negative for back pain.  Skin:  Negative for color change and rash.  Neurological:  Negative for syncope.  All other systems reviewed and are negative.  Physical Exam Updated Vital Signs BP 104/64    Pulse 60    Temp 98.2 F (36.8 C) (Oral)    Resp 20    SpO2 100%  Physical Exam Constitutional:      Appearance: He is well-developed.  HENT:     Head: Normocephalic.     Nose: Nose normal.  Eyes:     Extraocular Movements: Extraocular movements intact.  Cardiovascular:     Rate and Rhythm: Normal rate.  Pulmonary:     Effort: Pulmonary effort is normal.  Abdominal:     Tenderness: There is no abdominal tenderness. There is no guarding or rebound.  Skin:    Coloration: Skin is not jaundiced.  Neurological:     Mental Status: He is alert. Mental status is at baseline.    ED Results / Procedures / Treatments   Labs (all labs ordered are listed, but only abnormal results are displayed) Labs Reviewed  COMPREHENSIVE METABOLIC PANEL - Abnormal; Notable for the following components:      Result Value   Albumin 5.1 (*)  Total Bilirubin 1.3 (*)    All other components within normal limits  CBC - Abnormal; Notable for the following components:   Hemoglobin 17.3 (*)    All other components within normal limits  URINALYSIS, ROUTINE W REFLEX MICROSCOPIC - Abnormal; Notable for the following components:   Specific Gravity, Urine 1.032 (*)    Ketones, ur 5 (*)    Protein, ur 30 (*)    Bacteria, UA RARE (*)    All other components within normal limits  LIPASE, BLOOD    EKG None  Radiology No results found.  Procedures Procedures    Medications Ordered in ED Medications  ondansetron (ZOFRAN-ODT) disintegrating tablet 4 mg (4 mg Oral Given 08/06/21 1428)  sodium chloride 0.9 % bolus 1,000 mL (0 mLs Intravenous  Stopped 08/06/21 1624)    ED Course/ Medical Decision Making/ A&P                           Medical Decision Making Amount and/or Complexity of Data Reviewed Labs: ordered.  Risk Prescription drug management.   Basic labs are sent is unremarkable chemistry white count normal.  Patient given Zofran tolerating oral intake.  Discharged home stable condition.        Final Clinical Impression(s) / ED Diagnoses Final diagnoses:  Vomiting and diarrhea    Rx / DC Orders ED Discharge Orders          Ordered    ondansetron (ZOFRAN-ODT) 4 MG disintegrating tablet        08/06/21 1612              Cheryll Cockayne, MD 08/07/21 [phone]
